# Patient Record
Sex: Female | Born: 1985 | Race: Black or African American | Hispanic: No | Marital: Married | Smoking: Never smoker
Health system: Southern US, Community
[De-identification: ages and names within clinical notes are randomized; demographics above are authoritative.]

---

## 2014-06-13 ENCOUNTER — Emergency Department (HOSPITAL_COMMUNITY)
Admission: EM | Admit: 2014-06-13 | Discharge: 2014-06-13 | Disposition: A | Discharge status: Home / Self Care | Payer: Self-pay | Attending: Emergency Medicine | Admitting: Emergency Medicine

## 2014-06-13 ENCOUNTER — Encounter (HOSPITAL_COMMUNITY): Payer: Self-pay | Admitting: Emergency Medicine

## 2014-06-13 DIAGNOSIS — Z791 Long term (current) use of non-steroidal anti-inflammatories (NSAID): Secondary | ICD-10-CM | POA: Insufficient documentation

## 2014-06-13 DIAGNOSIS — R21 Rash and other nonspecific skin eruption: Secondary | ICD-10-CM | POA: Insufficient documentation

## 2014-06-13 DIAGNOSIS — B002 Herpesviral gingivostomatitis and pharyngotonsillitis: Secondary | ICD-10-CM

## 2014-06-13 DIAGNOSIS — B009 Herpesviral infection, unspecified: Secondary | ICD-10-CM | POA: Insufficient documentation

## 2014-06-13 DIAGNOSIS — Z79899 Other long term (current) drug therapy: Secondary | ICD-10-CM | POA: Insufficient documentation

## 2014-06-13 MED ORDER — ACYCLOVIR 400 MG PO TABS: 400.0000 mg | ORAL_TABLET | Freq: Three times a day (TID) | ORAL | Status: DC

## 2014-06-13 NOTE — ED Provider Notes (Signed)
CSN: [id number]     Arrival date & time 06/13/14  1136 History   First MD Initiated Contact with Patient 06/13/14 1151     Chief Complaint  Patient presents with  . Oral Swelling     (Consider location/radiation/quality/duration/timing/severity/associated sxs/prior Treatment) Patient is a 28 y.o. female presenting with rash.  Rash Location:  Mouth Mouth rash location:  Lower outer lip Quality: blistering, burning and swelling   Severity:  Severe Onset quality:  Gradual Duration:  2 days Timing:  Constant Progression:  Worsening Chronicity:  New Context comment:  Burning sensation started 4 days ago, blisters started 2 days ago, swelling dramatically worse when she awoke today. Relieved by:  Nothing Ineffective treatments: abreva. Associated symptoms: no abdominal pain, no fever, no hoarse voice, no nausea, no shortness of breath, no sore throat, no throat swelling and no tongue swelling     History reviewed. No pertinent past medical history. History reviewed. No pertinent past surgical history. History reviewed. No pertinent family history. History  Substance Use Topics  . Smoking status: Never Smoker   . Smokeless tobacco: Not on file  . Alcohol Use: Yes     Comment: social   OB History   Grav Para Term Preterm Abortions TAB SAB Ect Mult Living                 Review of Systems  Constitutional: Negative for fever.  HENT: Negative for hoarse voice and sore throat.   Respiratory: Negative for shortness of breath.   Gastrointestinal: Negative for nausea and abdominal pain.  Skin: Positive for rash.  All other systems reviewed and are negative.     Allergies  Review of patient's allergies indicates no known allergies.  Home Medications   Prior to Admission medications   Medication Sig Start Date End Date Taking? Authorizing Provider  Docosanol (ABREVA) 10 % CREA Apply 1 application topically 4 (four) times daily as needed (for cold sores).   Yes Historical  Provider, MD  ibuprofen (ADVIL,MOTRIN) 200 MG tablet Take 400 mg by mouth every 6 (six) hours as needed for moderate pain.   Yes Historical Provider, MD  L-Lysine 1000 MG TABS Take 1 tablet by mouth daily.   Yes Historical Provider, MD   BP 139/75  Pulse 77  Temp(Src) 98.3 F (36.8 C) (Oral)  Resp 18  SpO2 99%  LMP 06/10/2014 Physical Exam  Nursing note and vitals reviewed. Constitutional: She is oriented to person, place, and time. She appears well-developed and well-nourished. No distress.  HENT:  Head: Normocephalic and atraumatic.  Right Ear: Ear canal normal. No middle ear effusion.  Left Ear: Ear canal normal.  No middle ear effusion.  Mouth/Throat:    Eyes: Conjunctivae are normal. No scleral icterus.  Neck: Neck supple.  Cardiovascular: Normal rate and intact distal pulses.   Pulmonary/Chest: Effort normal. No stridor. No respiratory distress.  Abdominal: Normal appearance. She exhibits no distension.  Neurological: She is alert and oriented to person, place, and time.  Skin: Skin is warm and dry. No rash noted.  Psychiatric: She has a normal mood and affect. Her behavior is normal.    ED Course  Procedures (including critical care time) Labs Review Labs Reviewed - No data to display  Imaging Review No results found.   EKG Interpretation None      MDM   Final diagnoses:  Primary herpes simplex infection of oral region    28 yo female with apparent initial herpes simplex outbreak.  She has  associated angioedema which prompted her visit today.  No signs of systemic allergic reaction.  Does not take ACE inhibitors.  No stridor or intraoral swelling.  Plan treatment with acyclovir.      Candyce ChurnJohn David Alonzo Loving III, MD 06/13/14 [phone]

## 2014-06-13 NOTE — ED Notes (Signed)
[place]ymoon [place] beach. Noted on Tuesday what looked like cold sore.  Pt bought abreva and started using that.  Last night and today noted large swelling to rt lip.  No difficulty swallowing or breathing.

## 2014-06-13 NOTE — Progress Notes (Signed)
[facility] provided pt with a list of primary care resources and a [facility] application to help patient establish primary care.  °

## 2015-01-29 ENCOUNTER — Emergency Department (INDEPENDENT_AMBULATORY_CARE_PROVIDER_SITE_OTHER)
Admission: EM | Admit: 2015-01-29 | Discharge: 2015-01-29 | Disposition: A | Discharge status: Home / Self Care | Payer: Self-pay | Source: Home / Self Care | Attending: Family Medicine | Admitting: Family Medicine

## 2015-01-29 ENCOUNTER — Encounter (HOSPITAL_COMMUNITY): Payer: Self-pay | Admitting: *Deleted

## 2015-01-29 DIAGNOSIS — G44219 Episodic tension-type headache, not intractable: Secondary | ICD-10-CM

## 2015-01-29 MED ORDER — KETOROLAC TROMETHAMINE 60 MG/2ML IM SOLN
60.0000 mg | Freq: Once | INTRAMUSCULAR | Status: AC
  Administered 2015-01-29: 60 mg via INTRAMUSCULAR

## 2015-01-29 MED ORDER — KETOROLAC TROMETHAMINE 60 MG/2ML IM SOLN
INTRAMUSCULAR | Status: AC
  Filled 2015-01-29: qty 2

## 2015-01-29 MED ORDER — DEXAMETHASONE SODIUM PHOSPHATE 10 MG/ML IJ SOLN
10.0000 mg | Freq: Once | INTRAMUSCULAR | Status: AC
  Administered 2015-01-29: 10 mg via INTRAMUSCULAR

## 2015-01-29 MED ORDER — DEXAMETHASONE SODIUM PHOSPHATE 10 MG/ML IJ SOLN
INTRAMUSCULAR | Status: AC
  Filled 2015-01-29: qty 1

## 2015-01-29 NOTE — Discharge Instructions (Signed)

## 2015-01-29 NOTE — ED Notes (Signed)
Pt  Reports  Symptoms  Of  Headache       With  Sensation  Of  Tightness  In his  Neck        Symptoms        Began    Last  Pm         Symptoms   Not  releived  By  OTC meds

## 2015-01-29 NOTE — ED Provider Notes (Signed)
CSN: [id number]     Arrival date & time 01/29/15  7829 History   First MD Initiated Contact with Patient 01/29/15 [phone]     Chief Complaint  Patient presents with  . Headache   (Consider location/radiation/quality/duration/timing/severity/associated sxs/prior Treatment) HPI Comments: 29 year old female is complaining of a headache that started rather suddenly last p.m. watching TV. The headache is located behind the left eye and left for head. She describes it as a throbbing type headache. She states that a little bit better today. She has had no photophobia, photophobia, nausea, vomiting, focal paresthesias or weakness. She has no history of headaches. She was unable to sleep well last night due to headache. Nothing seems to make it worse and nothing has made it better. She has taken Tylenol.   History reviewed. No pertinent past medical history. History reviewed. No pertinent past surgical history. History reviewed. No pertinent family history. History  Substance Use Topics  . Smoking status: Never Smoker   . Smokeless tobacco: Not on file  . Alcohol Use: Yes     Comment: social   OB History    No data available     Review of Systems  Constitutional: Negative for fever, activity change and fatigue.  Eyes: Negative for photophobia, pain, discharge and visual disturbance.  Respiratory: Negative for cough, shortness of breath and wheezing.   Cardiovascular: Negative for chest pain.  Gastrointestinal: Negative.   Genitourinary: Negative.   Musculoskeletal: Negative.   Skin: Negative.   Neurological: Positive for headaches. Negative for dizziness, tremors, seizures, syncope, facial asymmetry, speech difficulty and weakness.  Psychiatric/Behavioral: Negative for hallucinations, behavioral problems, confusion, self-injury and decreased concentration. The patient is not nervous/anxious.     Allergies  Review of patient's allergies indicates no known allergies.  Home Medications    Prior to Admission medications   Medication Sig Start Date End Date Taking? Authorizing Provider  acyclovir (ZOVIRAX) 400 MG tablet Take 1 tablet (400 mg total) by mouth 3 (three) times daily. 06/13/14   Blake Divine, MD  Docosanol (ABREVA) 10 % CREA Apply 1 application topically 4 (four) times daily as needed (for cold sores).    Historical Provider, MD  ibuprofen (ADVIL,MOTRIN) 200 MG tablet Take 400 mg by mouth every 6 (six) hours as needed for moderate pain.    Historical Provider, MD  L-Lysine 1000 MG TABS Take 1 tablet by mouth daily.    Historical Provider, MD   BP 126/85 mmHg  Pulse 93  Temp(Src) 98.2 F (36.8 C) (Oral)  Resp 16  SpO2 99%  LMP 01/18/2015 Physical Exam  Constitutional: She is oriented to person, place, and time. She appears well-developed and well-nourished. No distress.  HENT:  Head: Normocephalic and atraumatic.  Mouth/Throat: Oropharynx is clear and moist. No oropharyngeal exudate.  Bilateral TMs are normal. No hemotympanum, Oropharynx is clear. Soft palate rises symmetrically. No intraoral or pharyngeal edema or discoloration. Palpation of the left for head and temple and maxillary and para nasal sinus areas actually help reduce the discomfort.  Eyes: Conjunctivae and EOM are normal. Pupils are equal, round, and reactive to light. Right eye exhibits no discharge. Left eye exhibits no discharge.  Neck: Normal range of motion. Neck supple.  No paracervical, posterior cervical or trapezius muscle tenderness. She has full range of motion of the neck without pain or limitation.  Cardiovascular: Normal rate, regular rhythm, normal heart sounds and intact distal pulses.   No murmur heard. Pulmonary/Chest: Effort normal. No respiratory distress. She has no wheezes. She  has no rales.  Abdominal: Soft. There is no tenderness.  Musculoskeletal: Normal range of motion. She exhibits no edema or tenderness.  Lymphadenopathy:    She has no cervical adenopathy.   Neurological: She is alert and oriented to person, place, and time. She has normal strength. She displays no tremor. No cranial nerve deficit or sensory deficit. She exhibits normal muscle tone. She displays a negative Romberg sign. She displays no seizure activity. Coordination and gait normal. GCS eye subscore is 4. GCS verbal subscore is 5. GCS motor subscore is 6.  No dysmetria. Heel to toe is balanced.  Skin: Skin is warm and dry. No rash noted. No erythema.  Psychiatric: She has a normal mood and affect.  Nursing note and vitals reviewed.   ED Course  Procedures (including critical care time) Labs Review Labs Reviewed - No data to display  Imaging Review No results found.   MDM   1. Episodic tension-type headache, not intractable    Decadron 10 mg and Toradol 60 mg IM Home rest Headache sheet . Red flags and warnings discussed   Hayden RasmussenDavid Seanpaul Preece, NP 01/29/15 1022

## 2015-06-30 ENCOUNTER — Encounter (HOSPITAL_COMMUNITY): Payer: Self-pay | Admitting: Emergency Medicine

## 2015-06-30 ENCOUNTER — Emergency Department (HOSPITAL_COMMUNITY)
Admission: EM | Admit: 2015-06-30 | Discharge: 2015-06-30 | Disposition: A | Discharge status: Home / Self Care | Payer: Self-pay | Attending: Emergency Medicine | Admitting: Emergency Medicine

## 2015-06-30 DIAGNOSIS — Z79899 Other long term (current) drug therapy: Secondary | ICD-10-CM | POA: Insufficient documentation

## 2015-06-30 DIAGNOSIS — H109 Unspecified conjunctivitis: Secondary | ICD-10-CM | POA: Insufficient documentation

## 2015-06-30 MED ORDER — POLYMYXIN B-TRIMETHOPRIM 10000-0.1 UNIT/ML-% OP SOLN: 1.0000 [drp] | OPHTHALMIC | Status: DC

## 2015-06-30 NOTE — ED Notes (Addendum)
Pt A+Ox4, reports L eye itching, red and crusted shut in the mornings x2 days.  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  Pt denies visual changes.  Redness noted.  Skin otherwise PWD.  Pt denies other complaints.  MAEI, ambulatory with steady gait.  Speaking full/clear sentences, rr even/un-lab.  NAD.

## 2015-06-30 NOTE — ED Provider Notes (Signed)
CSN: [id number]     Arrival date & time 06/30/15  1502 History  This chart was scribed for non-physician practitioner, Santiago Glad, PA-C, working with Blake Divine, MD by Charline Bills, ED Scribe. This patient was seen in room WTR8/WTR8 and the patient's care was started at 3:32 PM.   Chief Complaint  Patient presents with  . Conjunctivitis    L eye, red "crusted shut", itching   The history is provided by the patient. No language interpreter was used.   HPI Comments: Jessica Rowe is a 29 y.o. female who presents to the Emergency Department complaining of constant, gradually worsening left eye redness for the past 2 days. Pt reports associated left eye itching, intermittent gritty feeling in left eye and greenish drainage. She states that she had to apply a warm wash cloth to her left eye this morning due to crusting. Pt denies fever, chills, eye pain, visual disturbances. She does not wear corrective lenses.  History reviewed. No pertinent past medical history. History reviewed. No pertinent past surgical history. No family history on file. History  Substance Use Topics  . Smoking status: Never Smoker   . Smokeless tobacco: Not on file  . Alcohol Use: Yes     Comment: social   OB History    No data available     Review of Systems  Constitutional: Negative for fever and chills.  Eyes: Positive for discharge, redness and itching. Negative for pain and visual disturbance.   Allergies  Review of patient's allergies indicates no known allergies.  Home Medications   Prior to Admission medications   Medication Sig Start Date End Date Taking? Authorizing Provider  L-Lysine 1000 MG TABS Take 1 tablet by mouth daily.    Historical Provider, MD   BP 129/84 mmHg  Pulse 70  Temp(Src) 98.7 F (37.1 C) (Oral)  Resp 12  Ht 5\' 2"  (1.575 m)  Wt 204 lb (92.534 kg)  BMI 37.30 kg/m2  SpO2 100%  LMP 05/15/2015 Physical Exam  Constitutional: She is oriented to person, place, and  time. She appears well-developed and well-nourished. No distress.  HENT:  Head: Normocephalic and atraumatic.  Eyes: EOM are normal. Pupils are equal, round, and reactive to light. Lids are everted and swept, no foreign bodies found. Left conjunctiva is injected.  Mild injection of L eye. No periorbital edema or erythema.   Visual Acuity  Right Eye Distance: 20/35 Left Eye Distance: 20/35 Bilateral Distance: 20/35       Neck: Neck supple. No tracheal deviation present.  Cardiovascular: Normal rate.   Pulmonary/Chest: Effort normal. No respiratory distress.  Musculoskeletal: Normal range of motion.  Neurological: She is alert and oriented to person, place, and time.  Skin: Skin is warm and dry.  Psychiatric: She has a normal mood and affect. Her behavior is normal.  Nursing note and vitals reviewed.  ED Course  Procedures (including critical care time) DIAGNOSTIC STUDIES: Oxygen Saturation is 100% on RA, normal by my interpretation.    COORDINATION OF CARE: 3:36 PM-Discussed treatment plan which includes eyedrops with pt at bedside and pt agreed to plan.   Labs Review Labs Reviewed - No data to display  Imaging Review No results found.   EKG Interpretation None      MDM   Final diagnoses:  None  Patient presents today with left eye redness and discharge.  She denies eye pain.  No vision changes.  Signs and symptoms most consistent with Bacterial Conjunctivitis.  Patient given antibiotic eye  drops.  Stable for discharge.  Return precautions given.  I personally performed the services described in this documentation, which was scribed in my presence. The recorded information has been reviewed and is accurate.    Santiago Glad, PA-C 06/30/15 1714  Blake Divine, MD 06/30/15 [phone]

## 2019-10-31 ENCOUNTER — Emergency Department (HOSPITAL_BASED_OUTPATIENT_CLINIC_OR_DEPARTMENT_OTHER): Payer: Self-pay

## 2019-10-31 ENCOUNTER — Encounter (HOSPITAL_BASED_OUTPATIENT_CLINIC_OR_DEPARTMENT_OTHER): Payer: Self-pay

## 2019-10-31 ENCOUNTER — Emergency Department (HOSPITAL_BASED_OUTPATIENT_CLINIC_OR_DEPARTMENT_OTHER)
Admission: EM | Admit: 2019-10-31 | Discharge: 2019-10-31 | Disposition: A | Discharge status: Home / Self Care | Payer: Self-pay | Attending: Emergency Medicine | Admitting: Emergency Medicine

## 2019-10-31 ENCOUNTER — Other Ambulatory Visit: Payer: Self-pay

## 2019-10-31 DIAGNOSIS — Y9389 Activity, other specified: Secondary | ICD-10-CM | POA: Insufficient documentation

## 2019-10-31 DIAGNOSIS — Y998 Other external cause status: Secondary | ICD-10-CM | POA: Insufficient documentation

## 2019-10-31 DIAGNOSIS — Y9289 Other specified places as the place of occurrence of the external cause: Secondary | ICD-10-CM | POA: Insufficient documentation

## 2019-10-31 DIAGNOSIS — T148XXA Other injury of unspecified body region, initial encounter: Secondary | ICD-10-CM | POA: Insufficient documentation

## 2019-10-31 DIAGNOSIS — X500XXA Overexertion from strenuous movement or load, initial encounter: Secondary | ICD-10-CM | POA: Insufficient documentation

## 2019-10-31 LAB — URINALYSIS, ROUTINE W REFLEX MICROSCOPIC
Bilirubin Urine: NEGATIVE
Glucose, UA: NEGATIVE mg/dL
Hgb urine dipstick: NEGATIVE
Ketones, ur: NEGATIVE mg/dL
Leukocytes,Ua: NEGATIVE
Nitrite: NEGATIVE
Protein, ur: NEGATIVE mg/dL
Specific Gravity, Urine: 1.01 (ref 1.005–1.030)
pH: 6 (ref 5.0–8.0)

## 2019-10-31 LAB — PREGNANCY, URINE: Preg Test, Ur: NEGATIVE

## 2019-10-31 MED ORDER — OXYCODONE-ACETAMINOPHEN 5-325 MG PO TABS
1.0000 | ORAL_TABLET | Freq: Once | ORAL | Status: AC
  Administered 2019-10-31: 1 via ORAL
  Filled 2019-10-31: qty 1

## 2019-10-31 MED ORDER — DEXAMETHASONE SODIUM PHOSPHATE 10 MG/ML IJ SOLN
10.0000 mg | Freq: Once | INTRAMUSCULAR | Status: AC
  Administered 2019-10-31: 10 mg via INTRAMUSCULAR
  Filled 2019-10-31: qty 1

## 2019-10-31 MED ORDER — DIAZEPAM 5 MG/ML IJ SOLN
2.5000 mg | Freq: Once | INTRAMUSCULAR | Status: AC
  Administered 2019-10-31: 2.5 mg via INTRAMUSCULAR
  Filled 2019-10-31: qty 2

## 2019-10-31 MED ORDER — METHOCARBAMOL 500 MG PO TABS: 500.0000 mg | ORAL_TABLET | Freq: Two times a day (BID) | ORAL | 0 refills | Status: DC

## 2019-10-31 NOTE — ED Provider Notes (Signed)
[facility] EMERGENCY DEPARTMENT Provider Note   CSN: [id number] Arrival date & time: 10/31/19  1711     History   Chief Complaint Chief Complaint  Patient presents with  . Back Pain    HPI Jessica Rowe is a 33 y.o. female who presents for evaluation of right upper back pain that began after a workout at 10am.  Patient reports that she worked out today with the nontender switch.  She states she did not do any heavy lifting but did do light stretches.  She reports that afterwards, she had pain started in her right upper back that crosses over her right shoulder into the anterior aspect of her right shoulder.  She feels like it may radiate slightly to the anterior chest.  She states she tried ibuprofen and Tylenol with minimal improvement.  Patient states that she did not fall or land on the shoulder.  She did not do any heavy weightlifting.  She states that the pain stays right in that area.  She does not have any numbness or weakness of her right upper extremity.  Patient states that pain is worse when she moves or bends.  She states that she has not any trouble breathing but does feel like the pain is slightly worse when she takes a deep breath in.  She has not noticed any hematuria, numbness/weakness of her extremities, saddle anesthesia, urinary or bowel incontinence.     The history is provided by the patient.    History reviewed. No pertinent past medical history.  There are no active problems to display for this patient.   History reviewed. No pertinent surgical history.   OB History   No obstetric history on file.      Home Medications    Prior to Admission medications   Medication Sig Start Date End Date Taking? Authorizing Provider  L-Lysine 1000 MG TABS Take 1 tablet by mouth daily.    [provider]  methocarbamol (ROBAXIN) 500 MG tablet Take 1 tablet (500 mg total) by mouth 2 (two) times daily. 10/31/19   Maxwell CaulLayden, Analycia Khokhar A, PA-C   trimethoprim-polymyxin b (POLYTRIM) ophthalmic solution Place 1-2 drops into the left eye every 4 (four) hours. Use for 7 days 06/30/15   Santiago GladLaisure, Heather, PA-C    Family History No family history on file.  Social History Social History   Tobacco Use  . Smoking status: Never Smoker  . Smokeless tobacco: Never Used  Substance Use Topics  . Alcohol use: Yes    Comment: weekly  . Drug use: No     Allergies   Patient has no known allergies.   Review of Systems Review of Systems  Respiratory: Negative for shortness of breath.   Musculoskeletal: Positive for back pain.       Right shoulder pain  Neurological: Negative for weakness and numbness.  All other systems reviewed and are negative.    Physical Exam Updated Vital Signs BP 136/81 (BP Location: Right Arm)   Pulse 67   Temp 98.5 F (36.9 C) (Oral)   Resp 16   Ht 5\' 2"  (1.575 m)   Wt 101.2 kg   LMP 10/11/2019   SpO2 100%   BMI 40.79 kg/m   Physical Exam Vitals signs and nursing note reviewed.  Constitutional:      Appearance: She is well-developed.  HENT:     Head: Normocephalic and atraumatic.  Eyes:     General: No scleral icterus.       Right  eye: No discharge.        Left eye: No discharge.     Conjunctiva/sclera: Conjunctivae normal.  Neck:      Comments: No midline C-spine tenderness.  Diffuse tenderness to palpation noted to the lower paraspinal muscles on the right side.  No overlying warmth, erythema.  Cardiovascular:     Pulses:          Radial pulses are 2+ on the right side and 2+ on the left side.  Pulmonary:     Effort: Pulmonary effort is normal.     Breath sounds: Normal breath sounds.     Comments: Limited exam secondary to body habitus but breath sounds appear equal. Musculoskeletal:       Back:       Arms:     Comments: Appointment tenderness noted to the posterior trapezius with overlying muscle tension and spasm.  No overlying warmth, erythema.  Diffuse muscular tenderness that  extends over the right anterior shoulder.  No bony deformity or crepitus noted.  Good range of motion of right shoulder without any difficulty.  No bony tenderness noted to right elbow, right wrist.  Right upper extremity is not edematous, erythematous.  No palpable defect noted.  No tenderness palpation of the left upper extremity.  Skin:    General: Skin is warm and dry.     Comments: Good distal cap refill. RUE is not dusky in appearance or cool to touch.  Neurological:     Mental Status: She is alert.     Comments: Sensation intact along major nerve distributions of BUE 5/5 strength BUE and BLE Equal grip strength   Psychiatric:        Speech: Speech normal.        Behavior: Behavior normal.      ED Treatments / Results  Labs (all labs ordered are listed, but only abnormal results are displayed) Labs Reviewed  PREGNANCY, URINE  URINALYSIS, ROUTINE W REFLEX MICROSCOPIC    EKG None  Radiology Dg Chest 2 View  Result Date: 10/31/2019 CLINICAL DATA:  Back pain following recent workout, initial encounter EXAM: CHEST - 2 VIEW COMPARISON:  None. FINDINGS: Cardiac shadow is within normal limits. The lungs are well aerated bilaterally. Mild left basilar atelectasis is noted posteriorly. No sizable effusion is seen. No bony abnormality is noted. IMPRESSION: Mild left basilar atelectasis posteriorly. Electronically Signed   By: Alcide Clever M.D.   On: 10/31/2019 17:58    Procedures Procedures (including critical care time)  Medications Ordered in ED Medications  oxyCODONE-acetaminophen (PERCOCET/ROXICET) 5-325 MG per tablet 1 tablet (1 tablet Oral Given 10/31/19 1842)  dexamethasone (DECADRON) injection 10 mg (10 mg Intramuscular Given 10/31/19 1841)  diazepam (VALIUM) injection 2.5 mg (2.5 mg Intramuscular Given 10/31/19 1839)     Initial Impression / Assessment and Plan / ED Course  I have reviewed the triage vital signs and the nursing notes.  Pertinent labs & imaging results  that were available during my care of the patient were reviewed by me and considered in my medical decision making (see chart for details).        33 year old female who presents for evaluation of right-sided shoulder and back pain after working out this morning.  No fall, injury.  She has not noted any numbness/weakness, warmth, erythema, edema.  No hematuria.  Does state slightly worse with taking deep breath in. Patient is afebrile, non-toxic appearing, sitting comfortably on examination table. Vital signs reviewed and stable.  Patient is neurovascularly  intact.  On exam, she has point tenderness noted posterior trapezius with diffuse muscular tenderness over the paraspinal muscles as well as over the anterior shoulder.  No bony deformity.  Good range of motion.  Right upper extremity is not swollen, erythematous.  Breath sounds appear equal but exam limited secondary to body habitus and effort.  Doubt pneumothorax but will obtain chest x-ray.  I suspect this is most likely musculoskeletal.  She has no midline bony tenderness that would require imaging.  Additionally, history/physical exam not concerning for ischemic limb, DVT of upper extremity, rhabdomyolysis.  Urine negative for any hemoglobin.  Urine pregnancy is negative.  Chest x-ray negative for any acute abnormalities.  Discussed results with patient.  We will plan to treat as musculoskeletal pain.  Patient given a short course of Robaxin to go home with. At this time, patient exhibits no emergent life-threatening condition that require further evaluation in ED or admission. Patient had ample opportunity for questions and discussion. All patient's questions were answered with full understanding. Strict return precautions discussed. Patient expresses understanding and agreement to plan.   Portions of this note were generated with [employer]. Dictation errors may occur despite best attempts at proofreading.  Final Clinical  Impressions(s) / ED Diagnoses   Final diagnoses:  Muscle strain    ED Discharge Orders         Ordered    methocarbamol (ROBAXIN) 500 MG tablet  2 times daily     10/31/19 1852           Desma Mcgregor 10/31/19 1915    Lajean Saver, MD 10/31/19 2239

## 2019-10-31 NOTE — ED Triage Notes (Signed)
Pt c/o pain to right upper back, neck, chest area-pain started ~10am after a workout-pain 5/10 with no movement and 10/10 with movement-NAD-pt with steady gait with right UE in flexed position and head tilted to left side for comfort-stood for triage-stating pain worse when she moves

## 2019-10-31 NOTE — Discharge Instructions (Signed)
You can take Tylenol or Ibuprofen as directed for pain. You can alternate Tylenol and Ibuprofen every 4 hours. If you take Tylenol at 1pm, then you can take Ibuprofen at 5pm. Then you can take Tylenol again at 9pm.   Take Robaxin as prescribed. This medication will make you drowsy so do not drive or drink alcohol when taking it.  Follow-up with [facility] to establish a primary care doctor if you do not have one.   Return the emergency department for any worsening pain, numbness/weakness, redness or swelling of the arm, blood in urine or any other worsening concerning symptoms.

## 2020-03-20 ENCOUNTER — Other Ambulatory Visit: Payer: Self-pay

## 2020-03-20 ENCOUNTER — Emergency Department (HOSPITAL_BASED_OUTPATIENT_CLINIC_OR_DEPARTMENT_OTHER)
Admission: EM | Admit: 2020-03-20 | Discharge: 2020-03-21 | Disposition: A | Discharge status: Home / Self Care | Payer: BC Managed Care – PPO | Attending: Emergency Medicine | Admitting: Emergency Medicine

## 2020-03-20 ENCOUNTER — Encounter (HOSPITAL_BASED_OUTPATIENT_CLINIC_OR_DEPARTMENT_OTHER): Payer: Self-pay | Admitting: *Deleted

## 2020-03-20 DIAGNOSIS — R102 Pelvic and perineal pain: Secondary | ICD-10-CM | POA: Diagnosis not present

## 2020-03-20 DIAGNOSIS — B9689 Other specified bacterial agents as the cause of diseases classified elsewhere: Secondary | ICD-10-CM

## 2020-03-20 DIAGNOSIS — N760 Acute vaginitis: Secondary | ICD-10-CM | POA: Insufficient documentation

## 2020-03-20 DIAGNOSIS — R109 Unspecified abdominal pain: Secondary | ICD-10-CM | POA: Diagnosis present

## 2020-03-20 DIAGNOSIS — Z79899 Other long term (current) drug therapy: Secondary | ICD-10-CM | POA: Diagnosis not present

## 2020-03-20 LAB — URINALYSIS, ROUTINE W REFLEX MICROSCOPIC
Bilirubin Urine: NEGATIVE
Glucose, UA: NEGATIVE mg/dL
Hgb urine dipstick: NEGATIVE
Ketones, ur: 15 mg/dL — AB
Leukocytes,Ua: NEGATIVE
Nitrite: NEGATIVE
Protein, ur: NEGATIVE mg/dL
Specific Gravity, Urine: 1.01 (ref 1.005–1.030)
pH: 6 (ref 5.0–8.0)

## 2020-03-20 LAB — WET PREP, GENITAL
Sperm: NONE SEEN
Trich, Wet Prep: NONE SEEN
Yeast Wet Prep HPF POC: NONE SEEN

## 2020-03-20 LAB — PREGNANCY, URINE: Preg Test, Ur: NEGATIVE

## 2020-03-20 MED ORDER — METRONIDAZOLE 500 MG PO TABS: 500.0000 mg | ORAL_TABLET | Freq: Two times a day (BID) | ORAL | 0 refills | Status: DC

## 2020-03-20 NOTE — ED Provider Notes (Signed)
[facility] Provider Note: Lowella Dell, MD, FACEP  CSN: [id number] MRN: [id number] ARRIVAL: 03/20/20 at 2030 ROOM: [facility]03/[facility]03   CHIEF COMPLAINT  Abdominal Pain   HISTORY OF PRESENT ILLNESS  03/20/20 11:19 PM Jessica Rowe is a 34 y.o. female who has had lower abdominal pain off and on for several months, worse the past 2 months.  It seems to occur randomly although there may be some correlation with her menses.  The pain is variously described as sharp and dull because it tends to vary.  It is currently about a 4 out of 10 and located inferior to the umbilicus.  She has not seen an OB/GYN about this but has been advised to pursue pelvic imaging.  She is not having vaginal bleeding currently and her last menstrual period was 03/09/2020.  She is not having dysuria.  She thought she saw some blood in her urine earlier.   History reviewed. No pertinent past medical history.  History reviewed. No pertinent surgical history.  No family history on file.  Social History   Tobacco Use  . Smoking status: Never Smoker  . Smokeless tobacco: Never Used  Substance Use Topics  . Alcohol use: Yes    Comment: weekly  . Drug use: No    Prior to Admission medications   Medication Sig Start Date End Date Taking? Authorizing Provider  L-Lysine 1000 MG TABS Take 1 tablet by mouth daily.   Yes [provider]  mirabegron ER (MYRBETRIQ) 25 MG TB24 tablet Take by mouth. 02/01/20  Yes [provider]  Multiple Vitamin (MULTIVITAMIN) tablet Take by mouth.   Yes [provider]  metroNIDAZOLE (FLAGYL) 500 MG tablet Take 1 tablet (500 mg total) by mouth 2 (two) times daily. One po bid x 7 days 03/20/20   Dhanvin Szeto, Jonny Ruiz, MD    Allergies Patient has no known allergies.   REVIEW OF SYSTEMS  Negative except as noted here or in the History of Present Illness.   PHYSICAL EXAMINATION  Initial Vital Signs Blood pressure (!) 161/93, pulse 89, temperature 98.7 F  (37.1 C), temperature source Oral, resp. rate 20, height 5\' 2"  (1.575 m), weight 95.3 kg, last menstrual period 03/09/2020, SpO2 100 %.  Examination General: Well-developed, well-nourished female in no acute distress; appearance consistent with age of record HENT: normocephalic; atraumatic Eyes: pupils equal, round and reactive to light; extraocular muscles intact Neck: supple Heart: regular rate and rhythm Lungs: clear to auscultation bilaterally Abdomen: soft; nondistended; mild suprapubic tenderness; no masses or hepatosplenomegaly; bowel sounds present GU: Normal external genitalia; physiologic appearing vaginal discharge; no vaginal bleeding; no cervical motion tenderness; no adnexal tenderness Extremities: No deformity; full range of motion; pulses normal Neurologic: Awake, alert and oriented; motor function intact in all extremities and symmetric; no facial droop Skin: Warm and dry Psychiatric: Normal mood and affect   RESULTS  Summary of this visit's results, reviewed and interpreted by myself:   EKG Interpretation  Date/Time:    Ventricular Rate:    PR Interval:    QRS Duration:   QT Interval:    QTC Calculation:   R Axis:     Text Interpretation:        Laboratory Studies: Results for orders placed or performed during the hospital encounter of 03/20/20 (from the past 24 hour(s))  Urinalysis, Routine w reflex microscopic     Status: Abnormal   Collection Time: 03/20/20  8:42 PM  Result Value Ref Range   Color, Urine YELLOW YELLOW  APPearance CLEAR CLEAR   Specific Gravity, Urine 1.010 1.005 - 1.030   pH 6.0 5.0 - 8.0   Glucose, UA NEGATIVE NEGATIVE mg/dL   Hgb urine dipstick NEGATIVE NEGATIVE   Bilirubin Urine NEGATIVE NEGATIVE   Ketones, ur 15 (A) NEGATIVE mg/dL   Protein, ur NEGATIVE NEGATIVE mg/dL   Nitrite NEGATIVE NEGATIVE   Leukocytes,Ua NEGATIVE NEGATIVE  Pregnancy, urine     Status: None   Collection Time: 03/20/20  8:42 PM  Result Value Ref Range    Preg Test, Ur NEGATIVE NEGATIVE  Wet prep, genital     Status: Abnormal   Collection Time: 03/20/20 11:32 PM   Specimen: Cervix  Result Value Ref Range   Yeast Wet Prep HPF POC NONE SEEN NONE SEEN   Trich, Wet Prep NONE SEEN NONE SEEN   Clue Cells Wet Prep HPF POC PRESENT (A) NONE SEEN   WBC, Wet Prep HPF POC MANY (A) NONE SEEN   Sperm NONE SEEN    Imaging Studies: No results found.  ED COURSE and MDM  Nursing notes, initial and subsequent vitals signs, including pulse oximetry, reviewed and interpreted by myself.  Vitals:   03/20/20 2036 03/20/20 2037 03/20/20 2338  BP: (!) 161/93  (!) 146/101  Pulse: 89  67  Resp: 20  14  Temp: 98.7 F (37.1 C)    TempSrc: Oral    SpO2: 100%  100%  Weight:  95.3 kg   Height:  5\' 2"  (1.575 m)    Medications - No data to display  Presence of clue cells suggest bacterial vaginosis and we will treat this.  This would not explain her pain, particularly pain of several months duration.  Differential diagnosis includes symptomatic uterine fibroid, ovarian cyst(s) and endometriosis.  We will start with pelvic ultrasound and refer to OB/GYN.  She is G0 P0.  PROCEDURES  Procedures   ED DIAGNOSES     ICD-10-CM   1. Pelvic pain in female  R10.2   2. BV (bacterial vaginosis)  N76.0    B96.89        Jaedan Huttner, MD 03/21/20 0000

## 2020-03-20 NOTE — ED Triage Notes (Signed)
Lower abdominal pain off and on since January. She has been evaluated several times for the pain without diagnosis.

## 2020-03-21 ENCOUNTER — Ambulatory Visit (HOSPITAL_BASED_OUTPATIENT_CLINIC_OR_DEPARTMENT_OTHER)
Admit: 2020-03-21 | Discharge: 2020-03-21 | Disposition: A | Discharge status: Home / Self Care | Payer: BC Managed Care – PPO | Attending: Emergency Medicine | Admitting: Emergency Medicine

## 2020-03-21 NOTE — ED Provider Notes (Signed)
Discussed [place] result with patient showing hydrosalpinx. She denies any significant pain with pelvic exam yesterday, no fever, doubt active PID infection and doubt finding represents abscess.   Recommend follow up with OBGYN.   Jessica Monday, MD 03/22/20 1426

## 2020-03-24 LAB — GC/CHLAMYDIA PROBE AMP (~~LOC~~) NOT AT ARMC
Chlamydia: NEGATIVE
Comment: NEGATIVE
Comment: NORMAL
Neisseria Gonorrhea: NEGATIVE

## 2021-10-08 IMAGING — US US PELVIS COMPLETE WITH TRANSVAGINAL
1 series · 14 of 25 positions shown · non-contrast
Comparison: None

CLINICAL DATA: 33-year-old female with pelvic pain for 2 months.

EXAM:
TRANSABDOMINAL AND TRANSVAGINAL ULTRASOUND OF PELVIS
TECHNIQUE: Both transabdominal and transvaginal ultrasound examinations of the
pelvis were performed. Transabdominal technique was performed for
global imaging of the pelvis including uterus, ovaries, adnexal
regions, and pelvic cul-de-sac. It was necessary to proceed with
endovaginal exam following the transabdominal exam to visualize the
ovaries and endometrium.

[Series 1: us pelvis complete with transvaginal · 14 of 120 slices shown]
[im 1/120]
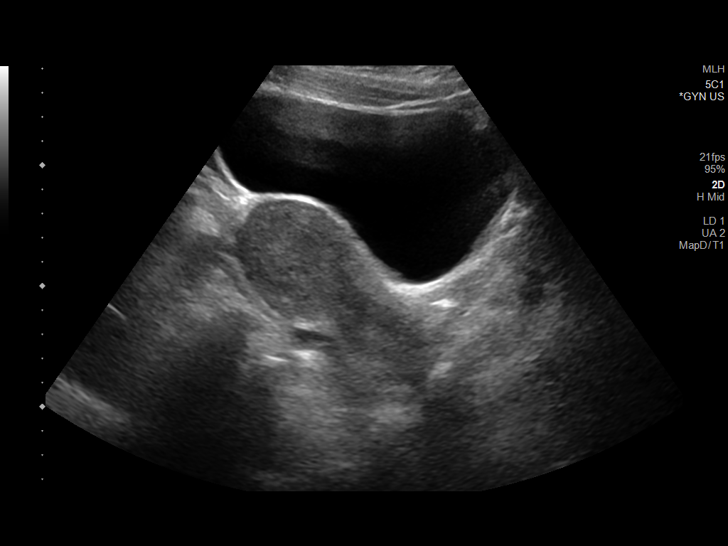
[im 10/120]
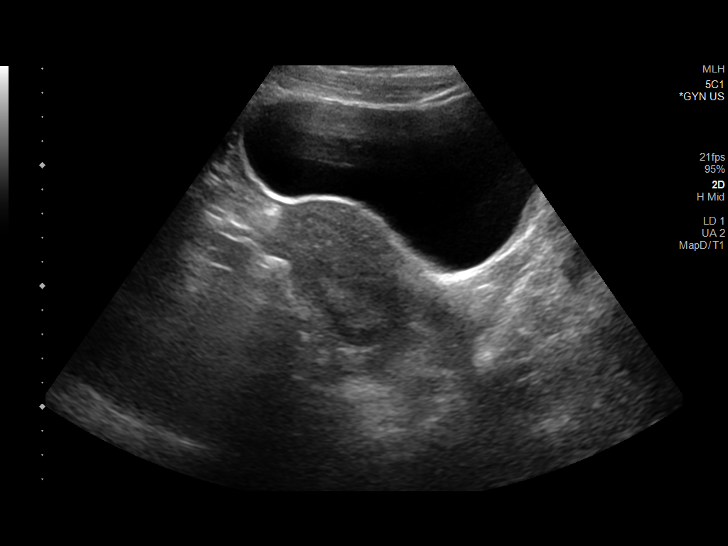
[im 20/120]
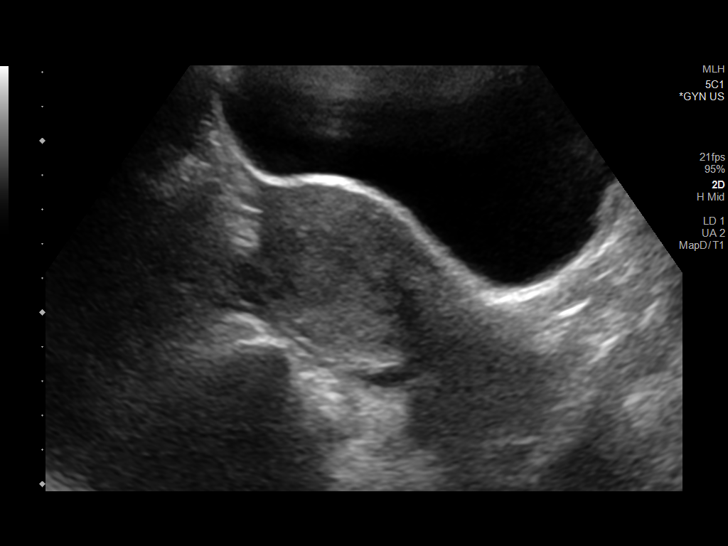
[im 30/120]
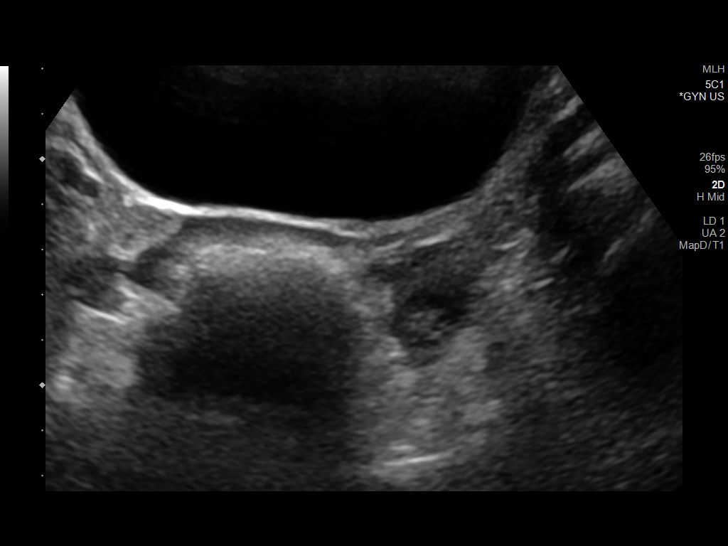
[im 40/120]
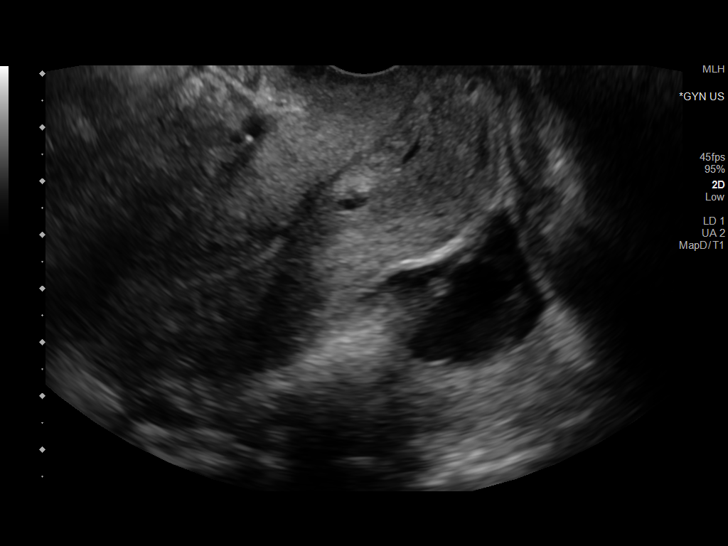
[im 45/120]
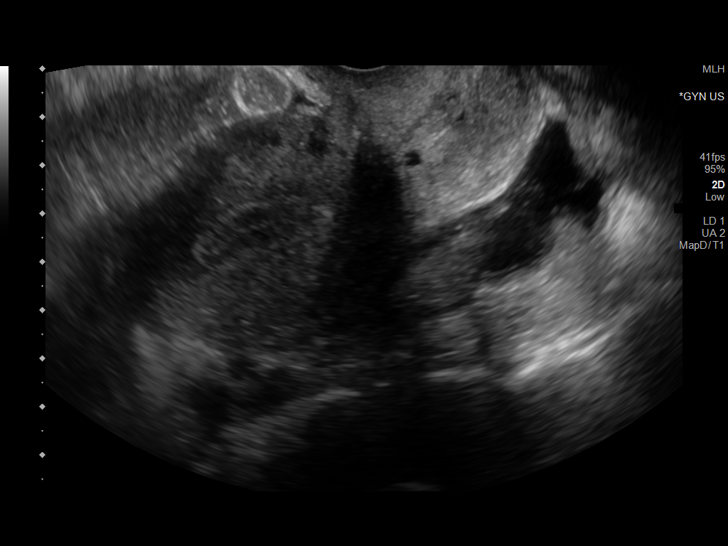
[im 55/120]
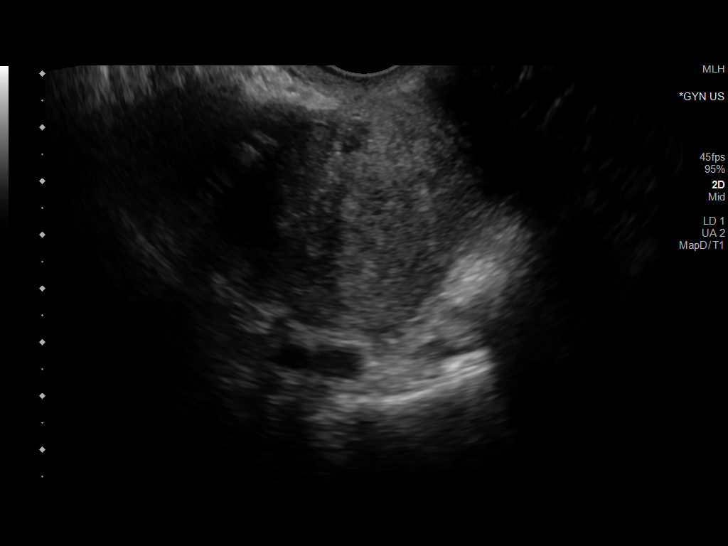
[im 65/120]
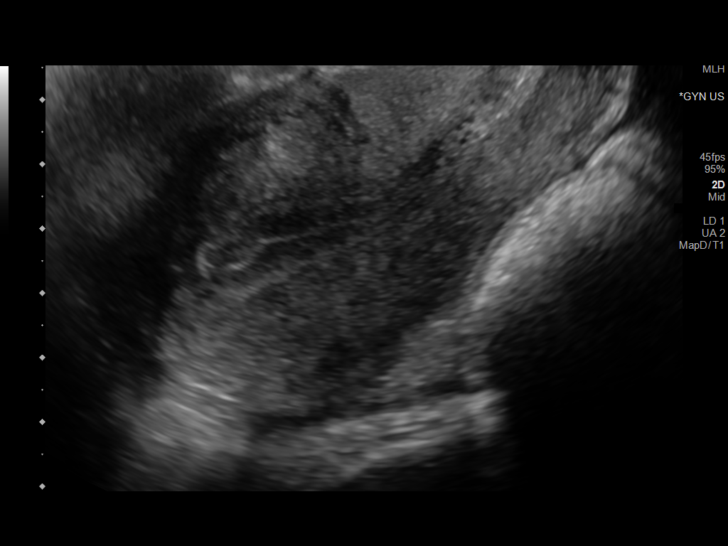
[im 75/120]
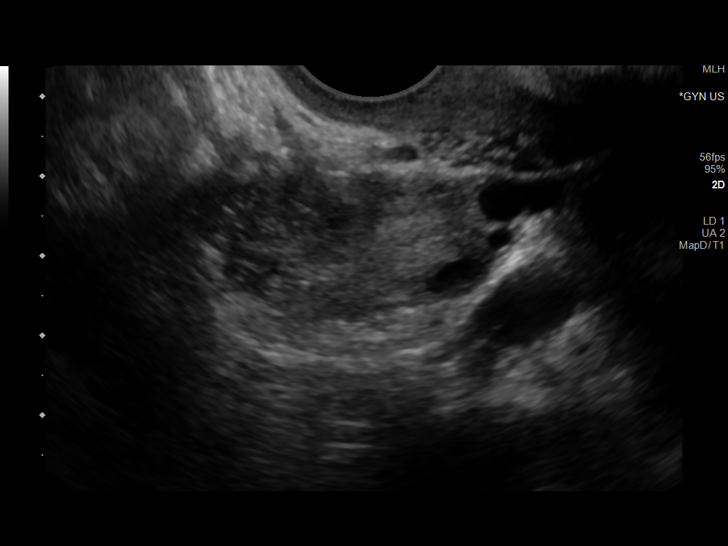
[im 80/120]
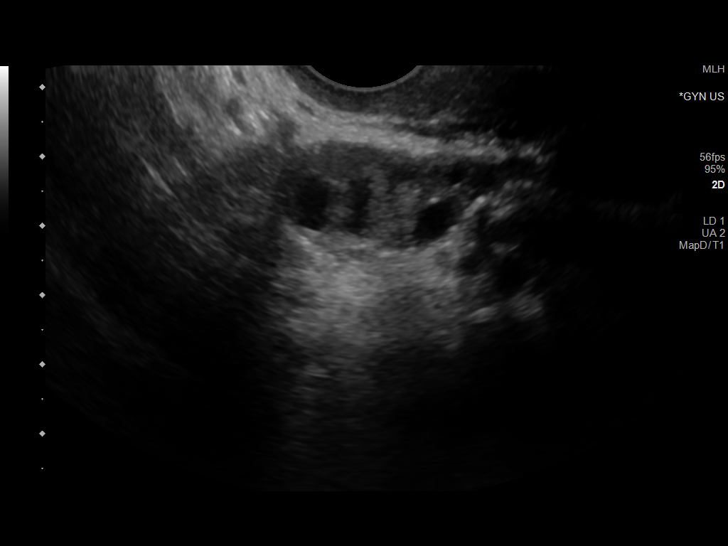
[im 90/120]
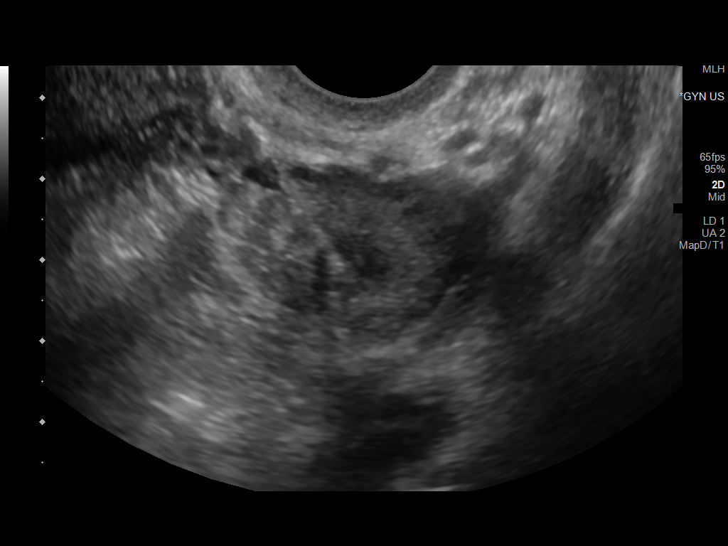
[im 100/120]
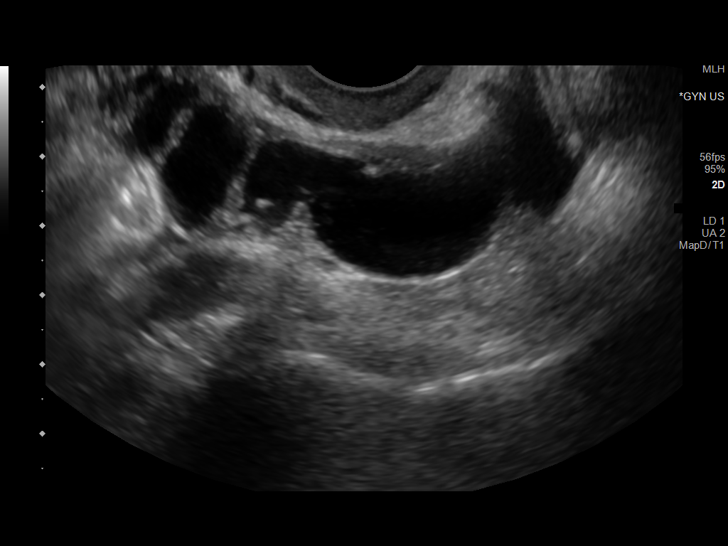
[im 110/120]
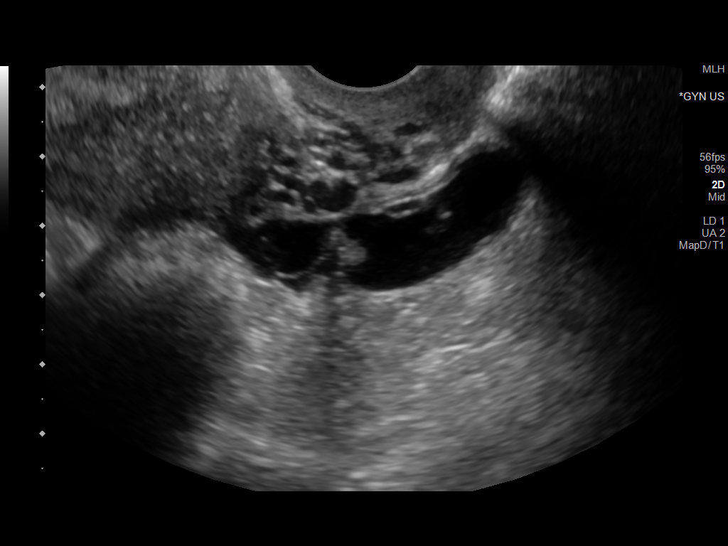
[im 120/120]
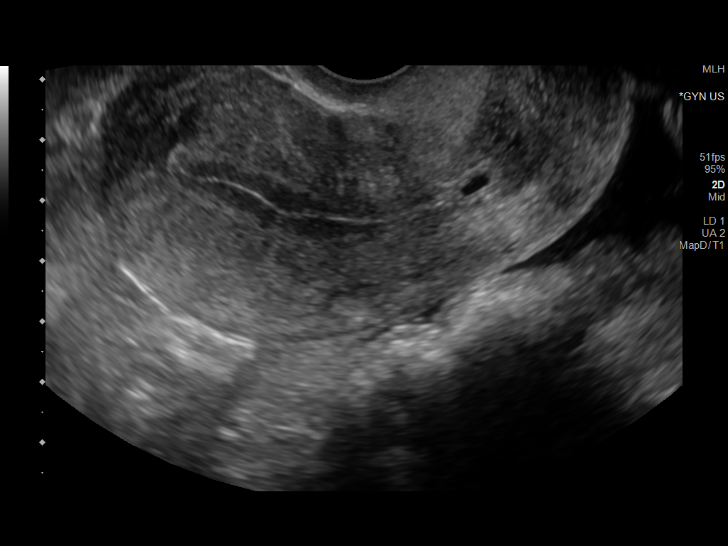

[14 of 25 positions shown; findings below may reference images not displayed]

FINDINGS: Uterus

Measurements: 10.3 x 4.3 x 5.7 cm = volume: 131 mL. No fibroids or
other mass visualized.

Endometrium

Thickness: 12.4 mm.  No focal abnormality visualized.

Right ovary

Measurements: 4.1 x 2 x 2.6 cm = volume: 11 mL. Normal appearance/no
adnexal mass.

Left ovary

Measurements: 3 x 2.2 x 2.9 cm = volume: 10 mL. Normal appearance.
LEFT hydrosalpinx noted.

Other findings

A trace amount of free pelvic fluid is noted.
IMPRESSION: LEFT hydrosalpinx.  Trace free fluid.

No other significant abnormalities.

## 2022-01-12 NOTE — Progress Notes (Signed)
NEW PATIENT Date of Service/Encounter:  01/13/22 Referring provider: Maud Deed, PA Primary care provider: Maud Deed, [place]  Subjective:  Jessica Rowe is a 36 y.o. female presenting today for evaluation of "allergic reaction". History obtained from: chart review and patient.   On December 26 she ate a shrimp platter at [employer].  She felt like her throat was tight that night, brushed it off as the food having too much salt.  She woke up and her face was swollen-thought maybe she slept too hard.  She reheated her leftover shrimp and her eyelids immediately got swollen and her face was itchy. Denies other systemic symptoms.  Her face remains swollen for a few days.  She has not eaten any shellfish since this event.  She has had finned fish and was able to tolerate it well.  She has had multiple occasions of immediate vomiting with stove top eggs. Has tried only eating whites, and same issues.  She use to eggs all the time when she was younger.  She is able to tolerate baked eggs.  She does have an Epipen. She is not carrying it though. Never been allergy tested.  Per PCP notes on 11/20/21-patient with 3 days of eyelid swelling after eating, treated with prednisone and zyrtec, Epipen provided.    Other allergy screening: Asthma: no Rhino conjunctivitis: no Medication allergy: no\] Hymenoptera allergy: no Urticaria: no Eczema:no History of recurrent infections suggestive of immunodeficency: no  Past Medical History: History reviewed. No pertinent past medical history. Medication List:  Current Outpatient Medications  Medication Sig Dispense Refill   EPINEPHrine (EPIPEN 2-PAK) 0.3 mg/0.3 mL IJ SOAJ injection Inject 0.3 mg into the muscle as needed for anaphylaxis.     escitalopram (LEXAPRO) 20 MG tablet Take 20 mg by mouth daily.     oxybutynin (DITROPAN) 5 MG tablet Take 5 mg by mouth daily.     No current facility-administered medications for this visit.   Known  Allergies:  No Known Allergies Past Surgical History: History reviewed. No pertinent surgical history. 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 [place]   ROS:  All other systems negative except as noted per HPI.  Objective:  Blood pressure 108/68, pulse 90, temperature (!) 97.2 F (36.2 C), temperature source Temporal, resp. rate 18, height 5\' 2"  (1.575 m), weight 218 lb 9.6 oz (99.2 kg), SpO2 98 %. Body mass index is 39.98 kg/m. Physical Exam:  General Appearance:  Alert, cooperative, no distress, appears stated age  Head:  Normocephalic, without obvious abnormality, atraumatic  Eyes:  Conjunctiva clear, EOM's intact  Nose: Nares normal, hypertrophic turbinates, normal mucosa, no visible anterior polyps, and septum midline  Throat: Lips, tongue normal; teeth and gums normal, normal posterior oropharynx  Neck: Supple, symmetrical  Lungs:   clear to auscultation bilaterally, Respirations unlabored, no coughing and intermittent dry coughing  Heart:  regular rate and rhythm and no murmur, Appears well perfused  Extremities: No edema  Skin: Skin color, texture, turgor normal, no rashes or lesions on visualized portions of skin  Neurologic: No gross deficits     Diagnostics: Skin Testing: Select foods.  Adequate controls. Results discussed with patient/family.  Food Adult Perc - 01/13/22 1400     Time Antigen Placed 1426    Allergen Manufacturer Waynette Buttery    Location Back    Number of allergen test 7     Control-buffer 50% Glycerol Negative    Control-Histamine 1 mg/ml 3+    6. Egg White, Chicken Negative    8. Shellfish Mix Negative    25. Shrimp Negative    26. Crab  Negative    27. Lobster Negative    28. Oyster Negative    29. Scallops Negative             Allergy testing results were read and interpreted by myself, documented by clinical staff. Allergy testing results were read and interpreted by myself, documented by clinical staff. Assessment and Plan  Certainly the timing of her events and recurrent trigger of strength causing angioedema with pruritus is concerning for food allergy.  The duration of her symptoms for several days is atypical.  However, concern is high enough that although skin testing today was negative, we will follow-up with blood testing.  If this is also negative, discussed oral challenge in clinic to stretch.  Her reaction to egg is also concerning for possible true food allergy versus egg intolerance.  Testing today was negative, but we will also follow-up with a blood test.  If negative, would still advise ongoing avoidance given the recurrent nature of her symptoms.  However, her likelihood of needing an epinephrine autoinjector for a reaction would be lower. Patient Instructions  Concern for possible food allergy:  Reaction hx: swelling and itching after eating shrimp Vomiting on multiple occasions after eating stove-top eggs (able to tolerate baked) - today's skin testing was negative to shellfish and egg - please strictly avoid shellfish and egg; will confirm with labwork - okay to resume eating baked eggs - for SKIN only reaction, okay to take Benadryl 2 capsules every 4 hours - for SKIN + ANY additional symptoms, OR IF concern for LIFE THREATENING reaction = Epipen Autoinjector EpiPen 0.3 mg. - If using Epinephrine autoinjector, call 911 - A food allergy action plan has been provided and discussed. - Medic Alert identification is recommended.  Will also check some additional labs to look at other possible causes of swelling/itching including a tryptase (looks at mast cell numbers which are the cells responsible for  these symptoms) and alpha-gal ( a rare delayed food reaction that occurs hours after eating mammalian meats - beef, pork, lamb, etc)  Will contact with results. If shellfish negative, will discuss oral challenge to shrimp in office.     This note in its entirety was forwarded to the Provider who requested this consultation.  Thank you for your kind referral. I appreciate the opportunity to take part in Trynity's care. Please do not hesitate to contact me with questions.  Sincerely,  Tonny Bollman, MD [facility]

## 2022-01-13 ENCOUNTER — Other Ambulatory Visit: Payer: Self-pay

## 2022-01-13 ENCOUNTER — Ambulatory Visit (INDEPENDENT_AMBULATORY_CARE_PROVIDER_SITE_OTHER): Payer: BC Managed Care – PPO | Admitting: Internal Medicine

## 2022-01-13 ENCOUNTER — Encounter: Payer: Self-pay | Admitting: Internal Medicine

## 2022-01-13 VITALS — BP 108/68 | HR 90 | Temp 97.2°F | Resp 18 | Ht 62.0 in | Wt 218.6 lb

## 2022-01-13 DIAGNOSIS — T783XXA Angioneurotic edema, initial encounter: Secondary | ICD-10-CM

## 2022-01-13 DIAGNOSIS — T7840XA Allergy, unspecified, initial encounter: Secondary | ICD-10-CM

## 2022-01-13 DIAGNOSIS — Z0182 Encounter for allergy testing: Secondary | ICD-10-CM

## 2022-01-13 DIAGNOSIS — T7800XA Anaphylactic reaction due to unspecified food, initial encounter: Secondary | ICD-10-CM

## 2022-01-13 DIAGNOSIS — T781XXA Other adverse food reactions, not elsewhere classified, initial encounter: Secondary | ICD-10-CM | POA: Diagnosis not present

## 2022-01-13 NOTE — Patient Instructions (Signed)
Concern for possible food allergy:  Reaction hx: swelling and itching after eating shrimp Vomiting on multiple occasions after eating stove-top eggs (able to tolerate baked) - today's skin testing was negative to shellfish and egg - please strictly avoid shellfish and egg; will confirm with labwork - okay to resume eating baked eggs - for SKIN only reaction, okay to take Benadryl 2 capsules every 4 hours - for SKIN + ANY additional symptoms, OR IF concern for LIFE THREATENING reaction = Epipen Autoinjector EpiPen 0.3 mg. - If using Epinephrine autoinjector, call 911 - A food allergy action plan has been provided and discussed. - Medic Alert identification is recommended.  Will also check some additional labs to look at other possible causes of swelling/itching including a tryptase (looks at mast cell numbers which are the cells responsible for these symptoms) and alpha-gal ( a rare delayed food reaction that occurs hours after eating mammalian meats - beef, pork, lamb, etc)  Will contact with results. If shellfish negative, will discuss oral challenge to shrimp in office.

## 2022-01-19 LAB — ALLERGENS, ZONE 2

## 2022-01-19 LAB — ALPHA-GAL PANEL
Allergen Lamb IgE: <0.1 kU/L
Beef IgE: <0.1 kU/L
IgE (Immunoglobulin E), Serum: 39 IU/mL (ref 6–495)
O215-IgE Alpha-Gal: <0.1 kU/L
Pork IgE: <0.1 kU/L

## 2022-01-19 LAB — ALLERGEN PROFILE, SHELLFISH
Clam IgE: <0.1 kU/L
F023-IgE Crab: <0.1 kU/L
F080-IgE Lobster: <0.1 kU/L
F290-IgE Oyster: <0.1 kU/L
Scallop IgE: <0.1 kU/L
Shrimp IgE: <0.1 kU/L

## 2022-01-19 LAB — ALLERGEN EGG WHITE F1: Egg White IgE: <0.1 kU/L

## 2022-01-19 LAB — TRYPTASE: Tryptase: 8.7 ug/L (ref 2.2–13.2)

## 2022-01-20 NOTE — Progress Notes (Signed)
Please let the patient know the following:  Your allergy results have returned and were negative for egg white, shellfish panel, alpha gal (the weird mammalian meat allergy), and environmental allergies.  Based on her history, would advise ongoing avoidance of egg but suspect this is more due to an intolerance.  Regarding shellfish, I am happy to set you up for an in office challenge to shrimp if you are interested.  You would need to bring in 5-6 jumbo shrimp or 8-10 regular shrimp.  You would need to be off antihistamines for 3 days prior to that visit.  Let me know if you have any questions.  Unfortunately this does not answer the question of why you have the swelling and itching in the first place, so if it happens again, treat with antihistamines (zyrtec 20 mg twice daily) until symptoms resolve.  Please keep a record of everything you did in the 3 hours prior to symptoms onset and let us know.  Sincerely, Tonny Bollman, MD [facility]
